# Patient Record
Sex: Female | Born: 1980 | Race: Black or African American | Marital: Married | Smoking: Never smoker
Health system: Southern US, Community
[De-identification: ages and names within clinical notes are randomized; demographics above are authoritative.]

## PROBLEM LIST (undated history)

## (undated) DIAGNOSIS — Z789 Other specified health status: Secondary | ICD-10-CM

## (undated) HISTORY — PX: KNEE SURGERY: SHX244

---

## 2004-01-28 ENCOUNTER — Other Ambulatory Visit: Admission: RE | Admit: 2004-01-28 | Discharge: 2004-01-28 | Payer: Self-pay | Admitting: Obstetrics and Gynecology

## 2004-04-24 ENCOUNTER — Other Ambulatory Visit: Admission: RE | Admit: 2004-04-24 | Discharge: 2004-04-24 | Payer: Self-pay | Admitting: Obstetrics and Gynecology

## 2005-02-16 ENCOUNTER — Other Ambulatory Visit: Admission: RE | Admit: 2005-02-16 | Discharge: 2005-02-16 | Payer: Self-pay | Admitting: Obstetrics and Gynecology

## 2005-04-29 ENCOUNTER — Other Ambulatory Visit: Admission: RE | Admit: 2005-04-29 | Discharge: 2005-04-29 | Payer: Self-pay | Admitting: Obstetrics and Gynecology

## 2006-05-05 ENCOUNTER — Other Ambulatory Visit: Admission: RE | Admit: 2006-05-05 | Discharge: 2006-05-05 | Payer: Self-pay | Admitting: Obstetrics and Gynecology

## 2006-12-09 ENCOUNTER — Ambulatory Visit (HOSPITAL_COMMUNITY): Admission: RE | Admit: 2006-12-09 | Discharge: 2006-12-09 | Payer: Self-pay | Admitting: Obstetrics and Gynecology

## 2007-04-28 ENCOUNTER — Inpatient Hospital Stay (HOSPITAL_COMMUNITY): Admission: AD | Admit: 2007-04-28 | Discharge: 2007-04-28 | Payer: Self-pay | Admitting: Obstetrics and Gynecology

## 2007-05-12 ENCOUNTER — Inpatient Hospital Stay (HOSPITAL_COMMUNITY): Admission: AD | Admit: 2007-05-12 | Discharge: 2007-05-14 | Payer: Self-pay | Admitting: Obstetrics and Gynecology

## 2007-08-28 ENCOUNTER — Other Ambulatory Visit: Admission: RE | Admit: 2007-08-28 | Discharge: 2007-08-28 | Payer: Self-pay | Admitting: Obstetrics and Gynecology

## 2008-07-17 ENCOUNTER — Other Ambulatory Visit: Admission: RE | Admit: 2008-07-17 | Discharge: 2008-07-17 | Payer: Self-pay | Admitting: Obstetrics and Gynecology

## 2009-09-25 ENCOUNTER — Other Ambulatory Visit: Admission: RE | Admit: 2009-09-25 | Discharge: 2009-09-25 | Payer: Self-pay | Admitting: Obstetrics and Gynecology

## 2010-10-28 ENCOUNTER — Other Ambulatory Visit: Payer: Self-pay | Admitting: Obstetrics and Gynecology

## 2010-10-28 ENCOUNTER — Other Ambulatory Visit (HOSPITAL_COMMUNITY)
Admission: RE | Admit: 2010-10-28 | Discharge: 2010-10-28 | Disposition: A | Discharge status: Home / Self Care | Payer: 59 | Source: Ambulatory Visit | Attending: Obstetrics and Gynecology | Admitting: Obstetrics and Gynecology

## 2010-10-28 DIAGNOSIS — Z01419 Encounter for gynecological examination (general) (routine) without abnormal findings: Secondary | ICD-10-CM | POA: Insufficient documentation

## 2011-01-05 NOTE — H&P (Signed)
NAMEJOELIE, Catherine Benson NO.:  [id number]   MEDICAL RECORD NO.:  [id number]          PATIENT TYPE:  MAT   LOCATION:  [facility]                          FACILITY:  [facility]   PHYSICIAN:  Gerald Leitz, MD          DATE OF BIRTH:  Dec 19, 1980   DATE OF ADMISSION:  04/28/2007  DATE OF DISCHARGE:  04/28/2007                              HISTORY & PHYSICAL   HISTORY AND PHYSICAL:  Patient scheduled for induction at [facility] on May 11, 2007.   HISTORY OF PRESENT ILLNESS:  This is a 30 year old G 2, P 0-0-1-0 at 63  weeks estimated gestational age based on last menstrual period of  August 03, 2006, confirmed by 7-week ultrasound, with estimated date  of delivery May 11, 2007.  Pregnancy complicated by gestational  hypertension.  Patient reports positive fetal movement.  No vaginal  bleeding, leakage of fluid or regular contractions.  No headache,  blurred vision or right upper quadrant pain.   PAST OB HISTORY:  Elective abortion x1.   PAST GYN HISTORY:  No history of abnormal Pap smears.  Last Pap smear  September of 2007, was normal.  Remote history of gonorrhea.   PAST MEDICAL HISTORY:  Negative.   PAST SURGICAL HISTORY:  Right ACL knee repair.   MEDICATIONS:  Prenatal vitamins, iron sulfate.   ALLERGIES:  PENICILLIN, which causes hives.   SOCIAL HISTORY:  Patient is single.  Denies tobacco, alcohol or illicit  drug use.   FAMILY HISTORY:  Mother with diabetes.   REVIEW OF SYSTEMS:  Negative except as stated in the history of current  illness.   PHYSICAL EXAMINATION:  VITAL SIGNS:  Blood pressure 138/84, weight 205  pounds.  CARDIOVASCULAR:  Regular rate and rhythm.  LUNGS:  Clear to auscultation bilaterally.  ABDOMEN:  Gravid, nontender.  EXTREMITIES:  One plus edema.  Two plus reflexes.  PELVIC:  Cervix is closed, soft, anterior.   ASSESSMENT AND PLAN:  A 40-week intrauterine pregnancy with gestational  hypertension, admit for Cervidil  induction.  Will monitor blood pressure  carefully.  Anticipate spontaneous vaginal delivery.      Gerald Leitz, MD  Electronically Signed     TC/MEDQ  D:  05/05/2007  T:  05/06/2007  Job:  [phone]

## 2011-06-03 LAB — CBC
HCT: 34.7 — ABNORMAL LOW
MCHC: 33.9
MCV: 94.7
Platelets: 235
RBC: 2.99 — ABNORMAL LOW
RDW: 15 — ABNORMAL HIGH
WBC: 21.1 — ABNORMAL HIGH

## 2011-06-03 LAB — COMPREHENSIVE METABOLIC PANEL
AST: 23
Albumin: 2.7 — ABNORMAL LOW
Calcium: 9.5
Creatinine, Ser: 0.91

## 2011-06-03 LAB — URINALYSIS, ROUTINE W REFLEX MICROSCOPIC
Nitrite: NEGATIVE
Specific Gravity, Urine: <1.005 — ABNORMAL LOW
Urobilinogen, UA: 0.2

## 2011-06-03 LAB — URIC ACID: Uric Acid, Serum: 7.3 — ABNORMAL HIGH

## 2011-06-03 LAB — RPR: RPR Ser Ql: NONREACTIVE

## 2011-06-04 LAB — CBC
HCT: 35 — ABNORMAL LOW
Platelets: 268
WBC: 7.9

## 2011-06-04 LAB — COMPREHENSIVE METABOLIC PANEL
ALT: 11
AST: 20
Alkaline Phosphatase: 195 — ABNORMAL HIGH
CO2: 23
Chloride: 106
Creatinine, Ser: 0.89
GFR calc non Af Amer: >60
Potassium: 4.2
Total Bilirubin: 0.6

## 2011-06-04 LAB — URINALYSIS, ROUTINE W REFLEX MICROSCOPIC
Glucose, UA: NEGATIVE
Protein, ur: NEGATIVE
pH: 6.5

## 2015-10-09 DIAGNOSIS — L309 Dermatitis, unspecified: Secondary | ICD-10-CM | POA: Insufficient documentation

## 2015-10-09 DIAGNOSIS — L280 Lichen simplex chronicus: Secondary | ICD-10-CM | POA: Insufficient documentation

## 2015-10-09 DIAGNOSIS — L700 Acne vulgaris: Secondary | ICD-10-CM | POA: Insufficient documentation

## 2017-10-27 ENCOUNTER — Encounter: Payer: Self-pay | Admitting: Obstetrics and Gynecology

## 2017-10-27 ENCOUNTER — Ambulatory Visit (INDEPENDENT_AMBULATORY_CARE_PROVIDER_SITE_OTHER): Payer: BLUE CROSS/BLUE SHIELD | Admitting: Obstetrics and Gynecology

## 2017-10-27 VITALS — BP 131/85 | HR 72 | Wt 204.6 lb

## 2017-10-27 DIAGNOSIS — Z01419 Encounter for gynecological examination (general) (routine) without abnormal findings: Secondary | ICD-10-CM | POA: Diagnosis not present

## 2017-10-27 DIAGNOSIS — Z1151 Encounter for screening for human papillomavirus (HPV): Secondary | ICD-10-CM

## 2017-10-27 DIAGNOSIS — Z124 Encounter for screening for malignant neoplasm of cervix: Secondary | ICD-10-CM | POA: Diagnosis not present

## 2017-10-27 NOTE — Progress Notes (Signed)
Subjective:     Catherine Benson is a 37 y.o. female P1 with Mirena IUD induced amenorrhea who is here for a comprehensive physical exam. The patient reports no problems. She is sexually active without complaints. She denies any pelvic pain or abnormal discharge. She denies any urinary incontinence. She does not plan to conceive No past medical history on file.  No family history on file.   Social History   Socioeconomic History  . Marital status: Single    Spouse name: Not on file  . Number of children: Not on file  . Years of education: Not on file  . Highest education level: Not on file  Social Needs  . Financial resource strain: Not on file  . Food insecurity - worry: Not on file  . Food insecurity - inability: Not on file  . Transportation needs - medical: Not on file  . Transportation needs - non-medical: Not on file  Occupational History  . Not on file  Tobacco Use  . Smoking status: Never Smoker  . Smokeless tobacco: Never Used  Substance and Sexual Activity  . Alcohol use: Yes  . Drug use: No  . Sexual activity: Yes  Other Topics Concern  . Not on file  Social History Narrative  . Not on file   Health Maintenance  Topic Date Due  . HIV Screening  04/24/1996  . TETANUS/TDAP  04/24/2000  . PAP SMEAR  10/27/2013  . INFLUENZA VACCINE  03/23/2017       Review of Systems Pertinent items are noted in HPI.   Objective:  Blood pressure 131/85, pulse 72, weight 204 lb 9.6 oz (92.8 kg).     GENERAL: Well-developed, well-nourished female in no acute distress.  HEENT: Normocephalic, atraumatic. Sclerae anicteric.  NECK: Supple. Normal thyroid.  LUNGS: Clear to auscultation bilaterally.  HEART: Regular rate and rhythm. BREASTS: Symmetric in size. No palpable masses or lymphadenopathy, skin changes, or nipple drainage. ABDOMEN: Soft, nontender, nondistended. No organomegaly. PELVIC: Normal external female genitalia. Vagina is pink and rugated.  Normal discharge. Normal  appearing cervix, IUD stings visualized extending 2 cm from the os. Uterus is normal in size. No adnexal mass or tenderness. EXTREMITIES: No cyanosis, clubbing, or edema, 2+ distal pulses.    Assessment:    Healthy female exam.      Plan:    Pap smear collected Health mainteance labs ordered Patient will be contacted with any abnormal results See After Visit Summary for Counseling Recommendations

## 2017-10-27 NOTE — Progress Notes (Signed)
LAST PAP 2012

## 2017-10-28 LAB — COMPREHENSIVE METABOLIC PANEL
A/G RATIO: 1.3 (ref 1.2–2.2)
ALT: 12 IU/L (ref 0–32)
AST: 18 IU/L (ref 0–40)
Albumin: 4.3 g/dL (ref 3.5–5.5)
Alkaline Phosphatase: 56 IU/L (ref 39–117)
BUN/Creatinine Ratio: 13 (ref 9–23)
BUN: 13 mg/dL (ref 6–20)
Bilirubin Total: 0.5 mg/dL (ref 0.0–1.2)
CALCIUM: 9.7 mg/dL (ref 8.7–10.2)
CO2: 24 mmol/L (ref 20–29)
CREATININE: 0.98 mg/dL (ref 0.57–1.00)
Chloride: 104 mmol/L (ref 96–106)
GFR calc Af Amer: 86 mL/min/{1.73_m2} (ref >59)
GFR calc non Af Amer: 74 mL/min/{1.73_m2} (ref >59)
GLOBULIN, TOTAL: 3.2 g/dL (ref 1.5–4.5)
Glucose: 76 mg/dL (ref 65–99)
POTASSIUM: 4.5 mmol/L (ref 3.5–5.2)
SODIUM: 142 mmol/L (ref 134–144)
Total Protein: 7.5 g/dL (ref 6.0–8.5)

## 2017-10-28 LAB — CYTOLOGY - PAP
DIAGNOSIS: NEGATIVE
HPV: NOT DETECTED

## 2017-10-28 LAB — LIPID PANEL
Chol/HDL Ratio: 2.8 ratio (ref 0.0–4.4)
Cholesterol, Total: 149 mg/dL (ref 100–199)
HDL: 53 mg/dL (ref >39)
LDL Calculated: 82 mg/dL (ref 0–99)
Triglycerides: 69 mg/dL (ref 0–149)
VLDL Cholesterol Cal: 14 mg/dL (ref 5–40)

## 2017-10-28 LAB — CBC
HEMATOCRIT: 39.3 % (ref 34.0–46.6)
Hemoglobin: 13 g/dL (ref 11.1–15.9)
MCH: 30 pg (ref 26.6–33.0)
MCHC: 33.1 g/dL (ref 31.5–35.7)
MCV: 91 fL (ref 79–97)
PLATELETS: 306 10*3/uL (ref 150–379)
RBC: 4.34 x10E6/uL (ref 3.77–5.28)
RDW: 13.6 % (ref 12.3–15.4)
WBC: 6.2 10*3/uL (ref 3.4–10.8)

## 2017-10-28 LAB — TSH: TSH: 0.842 u[IU]/mL (ref 0.450–4.500)

## 2017-10-28 LAB — HEMOGLOBIN A1C
ESTIMATED AVERAGE GLUCOSE: 114 mg/dL
Hgb A1c MFr Bld: 5.6 % (ref 4.8–5.6)

## 2018-09-05 ENCOUNTER — Encounter: Payer: Self-pay | Admitting: Radiology

## 2019-06-19 DIAGNOSIS — Z20828 Contact with and (suspected) exposure to other viral communicable diseases: Secondary | ICD-10-CM | POA: Diagnosis not present

## 2019-06-27 DIAGNOSIS — Z20828 Contact with and (suspected) exposure to other viral communicable diseases: Secondary | ICD-10-CM | POA: Diagnosis not present

## 2019-11-12 DIAGNOSIS — Z20822 Contact with and (suspected) exposure to covid-19: Secondary | ICD-10-CM | POA: Diagnosis not present

## 2019-12-27 DIAGNOSIS — R0789 Other chest pain: Secondary | ICD-10-CM | POA: Diagnosis not present

## 2019-12-27 DIAGNOSIS — S20219A Contusion of unspecified front wall of thorax, initial encounter: Secondary | ICD-10-CM | POA: Diagnosis not present

## 2019-12-27 DIAGNOSIS — M7989 Other specified soft tissue disorders: Secondary | ICD-10-CM | POA: Diagnosis not present

## 2019-12-27 DIAGNOSIS — M542 Cervicalgia: Secondary | ICD-10-CM | POA: Diagnosis not present

## 2019-12-27 DIAGNOSIS — S6992XA Unspecified injury of left wrist, hand and finger(s), initial encounter: Secondary | ICD-10-CM | POA: Diagnosis not present

## 2019-12-27 DIAGNOSIS — S20212A Contusion of left front wall of thorax, initial encounter: Secondary | ICD-10-CM | POA: Diagnosis not present

## 2020-01-07 DIAGNOSIS — Z20822 Contact with and (suspected) exposure to covid-19: Secondary | ICD-10-CM | POA: Diagnosis not present

## 2020-01-15 DIAGNOSIS — F4323 Adjustment disorder with mixed anxiety and depressed mood: Secondary | ICD-10-CM | POA: Diagnosis not present

## 2020-03-31 DIAGNOSIS — Z20822 Contact with and (suspected) exposure to covid-19: Secondary | ICD-10-CM | POA: Diagnosis not present

## 2020-05-27 DIAGNOSIS — Z20822 Contact with and (suspected) exposure to covid-19: Secondary | ICD-10-CM | POA: Diagnosis not present

## 2020-10-28 ENCOUNTER — Telehealth: Payer: Self-pay

## 2020-10-28 NOTE — Telephone Encounter (Signed)
Pt calling to see when her IUD was inserted.  [phone]  Adv 01/26/2016.

## 2020-12-02 DIAGNOSIS — M9901 Segmental and somatic dysfunction of cervical region: Secondary | ICD-10-CM | POA: Diagnosis not present

## 2020-12-02 DIAGNOSIS — M62830 Muscle spasm of back: Secondary | ICD-10-CM | POA: Diagnosis not present

## 2020-12-02 DIAGNOSIS — M9902 Segmental and somatic dysfunction of thoracic region: Secondary | ICD-10-CM | POA: Diagnosis not present

## 2020-12-02 DIAGNOSIS — M5412 Radiculopathy, cervical region: Secondary | ICD-10-CM | POA: Diagnosis not present

## 2020-12-30 DIAGNOSIS — M62830 Muscle spasm of back: Secondary | ICD-10-CM | POA: Diagnosis not present

## 2020-12-30 DIAGNOSIS — M9901 Segmental and somatic dysfunction of cervical region: Secondary | ICD-10-CM | POA: Diagnosis not present

## 2020-12-30 DIAGNOSIS — M5412 Radiculopathy, cervical region: Secondary | ICD-10-CM | POA: Diagnosis not present

## 2020-12-30 DIAGNOSIS — M9902 Segmental and somatic dysfunction of thoracic region: Secondary | ICD-10-CM | POA: Diagnosis not present

## 2021-02-11 DIAGNOSIS — M62830 Muscle spasm of back: Secondary | ICD-10-CM | POA: Diagnosis not present

## 2021-02-11 DIAGNOSIS — M5412 Radiculopathy, cervical region: Secondary | ICD-10-CM | POA: Diagnosis not present

## 2021-02-11 DIAGNOSIS — M9901 Segmental and somatic dysfunction of cervical region: Secondary | ICD-10-CM | POA: Diagnosis not present

## 2021-02-11 DIAGNOSIS — M9902 Segmental and somatic dysfunction of thoracic region: Secondary | ICD-10-CM | POA: Diagnosis not present

## 2021-03-11 DIAGNOSIS — M5412 Radiculopathy, cervical region: Secondary | ICD-10-CM | POA: Diagnosis not present

## 2021-03-11 DIAGNOSIS — M62830 Muscle spasm of back: Secondary | ICD-10-CM | POA: Diagnosis not present

## 2021-03-11 DIAGNOSIS — M9902 Segmental and somatic dysfunction of thoracic region: Secondary | ICD-10-CM | POA: Diagnosis not present

## 2021-03-11 DIAGNOSIS — M9901 Segmental and somatic dysfunction of cervical region: Secondary | ICD-10-CM | POA: Diagnosis not present

## 2021-03-12 DIAGNOSIS — Z20822 Contact with and (suspected) exposure to covid-19: Secondary | ICD-10-CM | POA: Diagnosis not present

## 2021-03-12 DIAGNOSIS — J0140 Acute pansinusitis, unspecified: Secondary | ICD-10-CM | POA: Diagnosis not present

## 2021-04-08 DIAGNOSIS — M9902 Segmental and somatic dysfunction of thoracic region: Secondary | ICD-10-CM | POA: Diagnosis not present

## 2021-04-08 DIAGNOSIS — M9901 Segmental and somatic dysfunction of cervical region: Secondary | ICD-10-CM | POA: Diagnosis not present

## 2021-04-08 DIAGNOSIS — M5412 Radiculopathy, cervical region: Secondary | ICD-10-CM | POA: Diagnosis not present

## 2021-04-08 DIAGNOSIS — M62830 Muscle spasm of back: Secondary | ICD-10-CM | POA: Diagnosis not present

## 2021-04-29 ENCOUNTER — Ambulatory Visit (INDEPENDENT_AMBULATORY_CARE_PROVIDER_SITE_OTHER): Payer: BC Managed Care – PPO | Admitting: Nurse Practitioner

## 2021-04-29 ENCOUNTER — Other Ambulatory Visit (HOSPITAL_COMMUNITY)
Admission: RE | Admit: 2021-04-29 | Discharge: 2021-04-29 | Disposition: A | Discharge status: Home / Self Care | Payer: BC Managed Care – PPO | Source: Ambulatory Visit | Attending: Nurse Practitioner | Admitting: Nurse Practitioner

## 2021-04-29 ENCOUNTER — Encounter: Payer: Self-pay | Admitting: Nurse Practitioner

## 2021-04-29 ENCOUNTER — Other Ambulatory Visit: Payer: Self-pay

## 2021-04-29 VITALS — BP 122/80 | HR 84 | Temp 98.4°F | Ht 63.2 in | Wt 189.4 lb

## 2021-04-29 DIAGNOSIS — Z2821 Immunization not carried out because of patient refusal: Secondary | ICD-10-CM | POA: Diagnosis not present

## 2021-04-29 DIAGNOSIS — R238 Other skin changes: Secondary | ICD-10-CM

## 2021-04-29 DIAGNOSIS — Z113 Encounter for screening for infections with a predominantly sexual mode of transmission: Secondary | ICD-10-CM | POA: Diagnosis not present

## 2021-04-29 DIAGNOSIS — E6609 Other obesity due to excess calories: Secondary | ICD-10-CM

## 2021-04-29 DIAGNOSIS — Z23 Encounter for immunization: Secondary | ICD-10-CM

## 2021-04-29 DIAGNOSIS — Z8616 Personal history of COVID-19: Secondary | ICD-10-CM

## 2021-04-29 DIAGNOSIS — Z124 Encounter for screening for malignant neoplasm of cervix: Secondary | ICD-10-CM | POA: Diagnosis not present

## 2021-04-29 DIAGNOSIS — Z1159 Encounter for screening for other viral diseases: Secondary | ICD-10-CM | POA: Diagnosis not present

## 2021-04-29 DIAGNOSIS — Z833 Family history of diabetes mellitus: Secondary | ICD-10-CM | POA: Diagnosis not present

## 2021-04-29 DIAGNOSIS — Z0000 Encounter for general adult medical examination without abnormal findings: Secondary | ICD-10-CM

## 2021-04-29 DIAGNOSIS — R233 Spontaneous ecchymoses: Secondary | ICD-10-CM

## 2021-04-29 DIAGNOSIS — Z114 Encounter for screening for human immunodeficiency virus [HIV]: Secondary | ICD-10-CM | POA: Diagnosis not present

## 2021-04-29 DIAGNOSIS — E66811 Body mass index (BMI) 33.0-33.9, adult: Secondary | ICD-10-CM

## 2021-04-29 DIAGNOSIS — Z7689 Persons encountering health services in other specified circumstances: Secondary | ICD-10-CM

## 2021-04-29 DIAGNOSIS — Z30433 Encounter for removal and reinsertion of intrauterine contraceptive device: Secondary | ICD-10-CM

## 2021-04-29 DIAGNOSIS — Z862 Personal history of diseases of the blood and blood-forming organs and certain disorders involving the immune mechanism: Secondary | ICD-10-CM

## 2021-04-29 DIAGNOSIS — Z6833 Body mass index (BMI) 33.0-33.9, adult: Secondary | ICD-10-CM

## 2021-04-29 DIAGNOSIS — Z1231 Encounter for screening mammogram for malignant neoplasm of breast: Secondary | ICD-10-CM

## 2021-04-29 NOTE — Patient Instructions (Signed)
Health Maintenance, Female Adopting a healthy lifestyle and getting preventive care are important in promoting health and wellness. Ask your health care provider about: The right schedule for you to have regular tests and exams. Things you can do on your own to prevent diseases and keep yourself healthy. What should I know about diet, weight, and exercise? Eat a healthy diet  Eat a diet that includes plenty of vegetables, fruits, low-fat dairy products, and lean protein. Do not eat a lot of foods that are high in solid fats, added sugars, or sodium. Maintain a healthy weight Body mass index (BMI) is used to identify weight problems. It estimates body fat based on height and weight. Your health care provider can help determine your BMI and help you achieve or maintain a healthy weight. Get regular exercise Get regular exercise. This is one of the most important things you can do for your health. Most adults should: Exercise for at least 150 minutes each week. The exercise should increase your heart rate and make you sweat (moderate-intensity exercise). Do strengthening exercises at least twice a week. This is in addition to the moderate-intensity exercise. Spend less time sitting. Even light physical activity can be beneficial. Watch cholesterol and blood lipids Have your blood tested for lipids and cholesterol at 40 years of age, then have this test every 5 years. Have your cholesterol levels checked more often if: Your lipid or cholesterol levels are high. You are older than 40 years of age. You are at high risk for heart disease. What should I know about cancer screening? Depending on your health history and family history, you may need to have cancer screening at various ages. This may include screening for: Breast cancer. Cervical cancer. Colorectal cancer. Skin cancer. Lung cancer. What should I know about heart disease, diabetes, and high blood pressure? Blood pressure and heart  disease High blood pressure causes heart disease and increases the risk of stroke. This is more likely to develop in people who have high blood pressure readings, are of African descent, or are overweight. Have your blood pressure checked: Every 3-5 years if you are 18-39 years of age. Every year if you are 40 years old or older. Diabetes Have regular diabetes screenings. This checks your fasting blood sugar level. Have the screening done: Once every three years after age 40 if you are at a normal weight and have a low risk for diabetes. More often and at a younger age if you are overweight or have a high risk for diabetes. What should I know about preventing infection? Hepatitis B If you have a higher risk for hepatitis B, you should be screened for this virus. Talk with your health care provider to find out if you are at risk for hepatitis B infection. Hepatitis C Testing is recommended for: Everyone born from 1945 through 1965. Anyone with known risk factors for hepatitis C. Sexually transmitted infections (STIs) Get screened for STIs, including gonorrhea and chlamydia, if: You are sexually active and are younger than 40 years of age. You are older than 40 years of age and your health care provider tells you that you are at risk for this type of infection. Your sexual activity has changed since you were last screened, and you are at increased risk for chlamydia or gonorrhea. Ask your health care provider if you are at risk. Ask your health care provider about whether you are at high risk for HIV. Your health care provider may recommend a prescription medicine   to help prevent HIV infection. If you choose to take medicine to prevent HIV, you should first get tested for HIV. You should then be tested every 3 months for as long as you are taking the medicine. Pregnancy If you are about to stop having your period (premenopausal) and you may become pregnant, seek counseling before you get  pregnant. Take 400 to 800 micrograms (mcg) of folic acid every day if you become pregnant. Ask for birth control (contraception) if you want to prevent pregnancy. Osteoporosis and menopause Osteoporosis is a disease in which the bones lose minerals and strength with aging. This can result in bone fractures. If you are 65 years old or older, or if you are at risk for osteoporosis and fractures, ask your health care provider if you should: Be screened for bone loss. Take a calcium or vitamin D supplement to lower your risk of fractures. Be given hormone replacement therapy (HRT) to treat symptoms of menopause. Follow these instructions at home: Lifestyle Do not use any products that contain nicotine or tobacco, such as cigarettes, e-cigarettes, and chewing tobacco. If you need help quitting, ask your health care provider. Do not use street drugs. Do not share needles. Ask your health care provider for help if you need support or information about quitting drugs. Alcohol use Do not drink alcohol if: Your health care provider tells you not to drink. You are pregnant, may be pregnant, or are planning to become pregnant. If you drink alcohol: Limit how much you use to 0-1 drink a day. Limit intake if you are breastfeeding. Be aware of how much alcohol is in your drink. In the [place] one drink equals one 12 oz bottle of beer (355 mL), one 5 oz glass of wine (148 mL), or one 1 oz glass of hard liquor (44 mL). General instructions Schedule regular health, dental, and eye exams. Stay current with your vaccines. Tell your health care provider if: You often feel depressed. You have ever been abused or do not feel safe at home. Summary Adopting a healthy lifestyle and getting preventive care are important in promoting health and wellness. Follow your health care provider's instructions about healthy diet, exercising, and getting tested or screened for diseases. Follow your health care provider's  instructions on monitoring your cholesterol and blood pressure. This information is not intended to replace advice given to you by your health care provider. Make sure you discuss any questions you have with your health care provider. Document Revised: 10/17/2020 Document Reviewed: 08/02/2018 Elsevier Patient Education  2022 Elsevier Inc.  

## 2021-04-29 NOTE — Progress Notes (Signed)
I,Yamilka Roman [employer] as a Education administrator for [employer], FNP.,have documented all relevant documentation on the behalf of Minette Brine, FNP,as directed by  Minette Brine, FNP while in the presence of Minette Brine, [place].   This visit occurred during the SARS-CoV-2 public health emergency.  Safety protocols were in place, including screening questions prior to the visit, additional usage of staff PPE, and extensive cleaning of exam room while observing appropriate contact time as indicated for disinfecting solutions.  Subjective:     Patient ID: Catherine Benson , female    DOB: 02-Nov-1980 , 40 y.o.   MRN: [id number]   Chief Complaint  Patient presents with   Establish Care   Annual Exam    HPI  Patient presents today to establish care. Her last physical was with Ollen Gross. She works as an Human resources officer.  Married with one child (girl) - 55 y/o.  Masters in [employer] with a concentration in [place].   She would like to have a physical done and would also like a referral to a GYN.  Wt Readings from Last 3 Encounters: 04/29/21 : 189 lb 6.4 oz (85.9 kg) 10/27/17 : 204 lb 9.6 oz (92.8 kg)      History reviewed. No pertinent past medical history.   Family History  Problem Relation Age of Onset   Diabetes Mother    Fibromyalgia Mother    Sarcoidosis Mother    COPD Father    Heart attack Father    Cancer Maternal Grandfather      Current Outpatient Medications:    levonorgestrel (MIRENA) 20 MCG/24HR IUD, by Intrauterine route., Disp: , Rfl:    fluconazole (DIFLUCAN) 100 MG tablet, Take 1 tablet (100 mg total) by mouth daily. Take 1 tablet by mouth now repeat in 5 days, Disp: 2 tablet, Rfl: 0   metroNIDAZOLE (FLAGYL) 500 MG tablet, Take 1 tablet (500 mg total) by mouth 3 (three) times daily for 10 days., Disp: 30 tablet, Rfl: 0   Vitamin D, Ergocalciferol, (DRISDOL) 1.25 MG (50000 UNIT) CAPS capsule, Take 1 capsule (50,000 Units total) by mouth every 7 (seven)  days., Disp: 12 capsule, Rfl: 1   Allergies  Allergen Reactions   Other Hives    Capers   Peach [Prunus Persica] Hives   Penicillins       The patient states she uses IUD for birth control. Last LMP was No LMP recorded. (Menstrual status: IUD).. Negative for Dysmenorrhea and Negative for Menorrhagia. Negative for: breast discharge, breast lump(s), breast pain and breast self exam. Associated symptoms include abnormal vaginal bleeding. Pertinent negatives include abnormal bleeding (hematology), anxiety, decreased libido, depression, difficulty falling sleep, dyspareunia, history of infertility, nocturia, sexual dysfunction, sleep disturbances, urinary incontinence, urinary urgency, vaginal discharge and vaginal itching. Diet regular.The patient states her exercise level is minimal.   The patient's tobacco use is:  Social History   Tobacco Use  Smoking Status Never   Passive exposure: Never  Smokeless Tobacco Never   She has been exposed to passive smoke. The patient's alcohol use is:  Social History   Substance and Sexual Activity  Alcohol Use Yes   Comment: socially   Additional information: Last pap 10/27/2017, next one scheduled for today.    Review of Systems  Constitutional: Negative.   HENT: Negative.    Eyes: Negative.   Respiratory: Negative.    Cardiovascular: Negative.   Gastrointestinal: Negative.   Endocrine: Negative.   Genitourinary: Negative.   Musculoskeletal: Negative.   Skin: Negative.  Allergic/Immunologic: Negative.   Neurological: Negative.   Hematological: Negative.   Psychiatric/Behavioral: Negative.      Today's Vitals   04/29/21 1604  BP: 122/80  Pulse: 84  Temp: 98.4 F (36.9 C)  Weight: 189 lb 6.4 oz (85.9 kg)  Height: 5' 3.2" (1.605 m)  PainSc: 0-No pain   Body mass index is 33.34 kg/m.   Objective:  Physical Exam Exam conducted with a chaperone present.  Constitutional:      General: She is not in acute distress.     Appearance: Normal appearance. She is well-developed. She is obese.  HENT:     Head: Normocephalic and atraumatic.     Right Ear: Hearing, tympanic membrane, ear canal and external ear normal. There is no impacted cerumen.     Left Ear: Hearing, tympanic membrane, ear canal and external ear normal. There is no impacted cerumen.     Nose:     Comments: Deferred - masked    Mouth/Throat:     Comments: Deferred - masked Eyes:     General: Lids are normal.     Extraocular Movements: Extraocular movements intact.     Conjunctiva/sclera: Conjunctivae normal.     Pupils: Pupils are equal, round, and reactive to light.     Funduscopic exam:    Right eye: No papilledema.        Left eye: No papilledema.  Neck:     Thyroid: No thyroid mass.     Vascular: No carotid bruit.  Cardiovascular:     Rate and Rhythm: Normal rate and regular rhythm.     Pulses: Normal pulses.     Heart sounds: Normal heart sounds. No murmur heard. Pulmonary:     Effort: Pulmonary effort is normal.     Breath sounds: Normal breath sounds.  Chest:     Chest wall: No mass.  Breasts:    Tanner Score is 5.     Right: Normal. No mass or tenderness.     Left: Normal. No mass or tenderness.  Abdominal:     General: Abdomen is flat. Bowel sounds are normal. There is no distension.     Palpations: Abdomen is soft.     Tenderness: There is no abdominal tenderness.  Genitourinary:    Exam position: Lithotomy position.     Tanner stage (genital): 5.     Labia:        Right: No rash or tenderness.        Left: No rash or tenderness.      Vagina: Normal.     Cervix: Normal.     Uterus: Normal.      Adnexa: Right adnexa normal and left adnexa normal.       Right: No tenderness.         Left: No tenderness.    Musculoskeletal:        General: No swelling. Normal range of motion.     Cervical back: Full passive range of motion without pain, normal range of motion and neck supple.     Right lower leg: No edema.      Left lower leg: No edema.  Lymphadenopathy:     Upper Body:     Right upper body: No supraclavicular, axillary or pectoral adenopathy.     Left upper body: No supraclavicular, axillary or pectoral adenopathy.  Skin:    General: Skin is warm and dry.     Capillary Refill: Capillary refill takes less than 2 seconds.  Neurological:  General: No focal deficit present.     Mental Status: She is alert and oriented to person, place, and time.     Cranial Nerves: No cranial nerve deficit.     Sensory: No sensory deficit.  Psychiatric:        Mood and Affect: Mood normal.        Behavior: Behavior normal.        Thought Content: Thought content normal.        Judgment: Judgment normal.        Assessment And Plan:     1. Encounter for general adult medical examination w/o abnormal findings Behavior modifications discussed and diet history reviewed.   Pt will continue to exercise regularly and modify diet with low GI, plant based foods and decrease intake of processed foods.  Recommend intake of daily multivitamin, Vitamin D, and calcium.  Recommend mammogram (up to date) for preventive screenings, as well as recommend immunizations that include influenza, TDAP (will try to look up) - CBC - CMP14+EGFR - Lipid panel  2. Screening for HIV (human immunodeficiency virus) - HIV antibody (with reflex)  3. Encounter for hepatitis C screening test for low risk patient Will check Hepatitis C screening due to recent recommendations to screen all adults 18 years and older - Hepatitis C antibody  4. Influenza vaccination declined Patient declined influenza vaccination at this time. Patient is aware that influenza vaccine prevents illness in 70% of healthy people, and reduces hospitalizations to 30-70% in elderly. This vaccine is recommended annually. Pt is willing to accept risk associated with refusing vaccination.  5. Class 1 obesity due to excess calories with body mass index (BMI) of 33.0  to 33.9 in adult, unspecified whether serious comorbidity present Chronic Discussed healthy diet and regular exercise options  Encouraged to exercise at least 150 minutes per week with 2 days of strength training  6. Encounter for IUD removal and reinsertion - Ambulatory referral to Obstetrics / Gynecology  7. History of COVID-19 Comments: She had Covid during Christmas 2021  8. Encounter for Papanicolaou smear of cervix Physical exam done, no abnormal findings. - Cytology -Pap Smear  9. Screening for STD (sexually transmitted disease) - Cervicovaginal ancillary only - T pallidum Screening [facility]  10. Family history of diabetes mellitus - Hemoglobin A1c  11. History of sickle cell trait  12. Easy bruising - Iron, TIBC and Ferritin Panel - VITAMIN D 25 Hydroxy (Vit-D Deficiency, Fractures)  13. Establishing care with new doctor, encounter for     Patient was given opportunity to ask questions. Patient verbalized understanding of the plan and was able to repeat key elements of the plan. All questions were answered to their satisfaction.   Minette Brine, FNP   I, Minette Brine, FNP, have reviewed all documentation for this visit. The documentation on 05/13/21 for the exam, diagnosis, procedures, and orders are all accurate and complete.  THE PATIENT IS ENCOURAGED TO PRACTICE SOCIAL DISTANCING DUE TO THE COVID-19 PANDEMIC.

## 2021-04-30 LAB — LIPID PANEL
Chol/HDL Ratio: 2.2 ratio (ref 0.0–4.4)
Cholesterol, Total: 172 mg/dL (ref 100–199)
HDL: 80 mg/dL (ref >39)
LDL Chol Calc (NIH): 71 mg/dL (ref 0–99)
Triglycerides: 120 mg/dL (ref 0–149)
VLDL Cholesterol Cal: 21 mg/dL (ref 5–40)

## 2021-04-30 LAB — CMP14+EGFR
ALT: 11 IU/L (ref 0–32)
AST: 22 IU/L (ref 0–40)
Albumin/Globulin Ratio: 1.6 (ref 1.2–2.2)
Albumin: 4.6 g/dL (ref 3.8–4.8)
Alkaline Phosphatase: 59 IU/L (ref 44–121)
BUN/Creatinine Ratio: 17 (ref 9–23)
BUN: 19 mg/dL (ref 6–24)
Bilirubin Total: 0.6 mg/dL (ref 0.0–1.2)
CO2: 22 mmol/L (ref 20–29)
Calcium: 9.9 mg/dL (ref 8.7–10.2)
Chloride: 99 mmol/L (ref 96–106)
Creatinine, Ser: 1.12 mg/dL — ABNORMAL HIGH (ref 0.57–1.00)
Globulin, Total: 2.8 g/dL (ref 1.5–4.5)
Glucose: 86 mg/dL (ref 65–99)
Potassium: 4.5 mmol/L (ref 3.5–5.2)
Sodium: 136 mmol/L (ref 134–144)
Total Protein: 7.4 g/dL (ref 6.0–8.5)
eGFR: 64 mL/min/{1.73_m2} (ref >59)

## 2021-04-30 LAB — HIV ANTIBODY (ROUTINE TESTING W REFLEX): HIV Screen 4th Generation wRfx: NONREACTIVE

## 2021-04-30 LAB — T PALLIDUM SCREENING CASCADE: T pallidum Antibodies (TP-PA): NONREACTIVE

## 2021-04-30 LAB — IRON,TIBC AND FERRITIN PANEL
Ferritin: 126 ng/mL (ref 15–150)
Iron Saturation: 22 % (ref 15–55)
Iron: 67 ug/dL (ref 27–159)
Total Iron Binding Capacity: 311 ug/dL (ref 250–450)
UIBC: 244 ug/dL (ref 131–425)

## 2021-04-30 LAB — HEMOGLOBIN A1C
Est. average glucose Bld gHb Est-mCnc: 108 mg/dL
Hgb A1c MFr Bld: 5.4 % (ref 4.8–5.6)

## 2021-04-30 LAB — CBC
Hematocrit: 41.1 % (ref 34.0–46.6)
Hemoglobin: 13.7 g/dL (ref 11.1–15.9)
MCH: 30.8 pg (ref 26.6–33.0)
MCHC: 33.3 g/dL (ref 31.5–35.7)
MCV: 92 fL (ref 79–97)
Platelets: 324 10*3/uL (ref 150–450)
RBC: 4.45 x10E6/uL (ref 3.77–5.28)
RDW: 12.8 % (ref 11.7–15.4)
WBC: 5.8 10*3/uL (ref 3.4–10.8)

## 2021-04-30 LAB — HEPATITIS C ANTIBODY: Hep C Virus Ab: <0.1 s/co ratio (ref 0.0–0.9)

## 2021-04-30 LAB — VITAMIN D 25 HYDROXY (VIT D DEFICIENCY, FRACTURES): Vit D, 25-Hydroxy: 22.6 ng/mL — ABNORMAL LOW (ref 30.0–100.0)

## 2021-05-01 ENCOUNTER — Other Ambulatory Visit: Payer: Self-pay | Admitting: Nurse Practitioner

## 2021-05-01 DIAGNOSIS — E559 Vitamin D deficiency, unspecified: Secondary | ICD-10-CM

## 2021-05-01 LAB — CERVICOVAGINAL ANCILLARY ONLY
Bacterial Vaginitis (gardnerella): POSITIVE — AB
Candida Glabrata: NEGATIVE
Candida Vaginitis: POSITIVE — AB
Chlamydia: NEGATIVE
Comment: NEGATIVE
Comment: NEGATIVE
Comment: NEGATIVE
Comment: NEGATIVE
Comment: NEGATIVE
Comment: NORMAL
Neisseria Gonorrhea: NEGATIVE
Trichomonas: NEGATIVE

## 2021-05-01 MED ORDER — VITAMIN D (ERGOCALCIFEROL) 1.25 MG (50000 UNIT) PO CAPS: 50000.0000 [IU] | ORAL_CAPSULE | ORAL | 1 refills | Status: DC

## 2021-05-03 ENCOUNTER — Other Ambulatory Visit: Payer: Self-pay | Admitting: Nurse Practitioner

## 2021-05-03 DIAGNOSIS — B9689 Other specified bacterial agents as the cause of diseases classified elsewhere: Secondary | ICD-10-CM

## 2021-05-03 DIAGNOSIS — B3731 Acute candidiasis of vulva and vagina: Secondary | ICD-10-CM

## 2021-05-03 DIAGNOSIS — N760 Acute vaginitis: Secondary | ICD-10-CM

## 2021-05-03 DIAGNOSIS — B373 Candidiasis of vulva and vagina: Secondary | ICD-10-CM

## 2021-05-03 MED ORDER — FLUCONAZOLE 100 MG PO TABS: 100.0000 mg | ORAL_TABLET | Freq: Every day | ORAL | 0 refills | Status: DC

## 2021-05-03 MED ORDER — METRONIDAZOLE 500 MG PO TABS: 500.0000 mg | ORAL_TABLET | Freq: Three times a day (TID) | ORAL | 0 refills | Status: AC

## 2021-05-05 LAB — CYTOLOGY - PAP: Diagnosis: NEGATIVE

## 2021-05-06 DIAGNOSIS — M9902 Segmental and somatic dysfunction of thoracic region: Secondary | ICD-10-CM | POA: Diagnosis not present

## 2021-05-06 DIAGNOSIS — M9901 Segmental and somatic dysfunction of cervical region: Secondary | ICD-10-CM | POA: Diagnosis not present

## 2021-05-06 DIAGNOSIS — M5412 Radiculopathy, cervical region: Secondary | ICD-10-CM | POA: Diagnosis not present

## 2021-05-06 DIAGNOSIS — M62830 Muscle spasm of back: Secondary | ICD-10-CM | POA: Diagnosis not present

## 2021-05-27 ENCOUNTER — Other Ambulatory Visit: Payer: Self-pay

## 2021-05-27 DIAGNOSIS — E559 Vitamin D deficiency, unspecified: Secondary | ICD-10-CM

## 2021-05-27 MED ORDER — VITAMIN D (ERGOCALCIFEROL) 1.25 MG (50000 UNIT) PO CAPS: 50000.0000 [IU] | ORAL_CAPSULE | ORAL | 1 refills | Status: DC

## 2021-06-03 DIAGNOSIS — M9901 Segmental and somatic dysfunction of cervical region: Secondary | ICD-10-CM | POA: Diagnosis not present

## 2021-06-03 DIAGNOSIS — M9902 Segmental and somatic dysfunction of thoracic region: Secondary | ICD-10-CM | POA: Diagnosis not present

## 2021-06-03 DIAGNOSIS — M62830 Muscle spasm of back: Secondary | ICD-10-CM | POA: Diagnosis not present

## 2021-06-03 DIAGNOSIS — M5412 Radiculopathy, cervical region: Secondary | ICD-10-CM | POA: Diagnosis not present

## 2021-06-25 DIAGNOSIS — Z30433 Encounter for removal and reinsertion of intrauterine contraceptive device: Secondary | ICD-10-CM | POA: Diagnosis not present

## 2021-07-15 ENCOUNTER — Ambulatory Visit: Payer: BC Managed Care – PPO

## 2021-07-19 ENCOUNTER — Emergency Department
Admission: EM | Admit: 2021-07-19 | Discharge: 2021-07-19 | Disposition: A | Discharge status: Home / Self Care | Payer: Self-pay | Attending: Emergency Medicine | Admitting: Emergency Medicine

## 2021-07-19 DIAGNOSIS — Z0283 Encounter for blood-alcohol and blood-drug test: Secondary | ICD-10-CM | POA: Insufficient documentation

## 2021-07-19 HISTORY — DX: Other specified health status: Z78.9
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
## 2021-07-19 NOTE — ED Notes (Signed)
Bed: BL25  Expected date:   Expected time:   Means of arrival:   Comments:  [employer]

## 2021-07-29 DIAGNOSIS — Z30431 Encounter for routine checking of intrauterine contraceptive device: Secondary | ICD-10-CM | POA: Diagnosis not present

## 2021-07-29 DIAGNOSIS — N898 Other specified noninflammatory disorders of vagina: Secondary | ICD-10-CM | POA: Diagnosis not present

## 2021-08-20 ENCOUNTER — Ambulatory Visit
Admission: RE | Admit: 2021-08-20 | Discharge: 2021-08-20 | Disposition: A | Discharge status: Home / Self Care | Payer: BC Managed Care – PPO | Source: Ambulatory Visit | Attending: Nurse Practitioner | Admitting: Nurse Practitioner

## 2021-08-20 DIAGNOSIS — Z1231 Encounter for screening mammogram for malignant neoplasm of breast: Secondary | ICD-10-CM

## 2021-11-24 DIAGNOSIS — M9902 Segmental and somatic dysfunction of thoracic region: Secondary | ICD-10-CM | POA: Diagnosis not present

## 2021-11-24 DIAGNOSIS — M5412 Radiculopathy, cervical region: Secondary | ICD-10-CM | POA: Diagnosis not present

## 2021-11-24 DIAGNOSIS — M62830 Muscle spasm of back: Secondary | ICD-10-CM | POA: Diagnosis not present

## 2021-11-24 DIAGNOSIS — M9901 Segmental and somatic dysfunction of cervical region: Secondary | ICD-10-CM | POA: Diagnosis not present

## 2021-12-08 DIAGNOSIS — M62830 Muscle spasm of back: Secondary | ICD-10-CM | POA: Diagnosis not present

## 2021-12-08 DIAGNOSIS — M5412 Radiculopathy, cervical region: Secondary | ICD-10-CM | POA: Diagnosis not present

## 2021-12-08 DIAGNOSIS — M9901 Segmental and somatic dysfunction of cervical region: Secondary | ICD-10-CM | POA: Diagnosis not present

## 2021-12-08 DIAGNOSIS — M9902 Segmental and somatic dysfunction of thoracic region: Secondary | ICD-10-CM | POA: Diagnosis not present

## 2021-12-22 DIAGNOSIS — M9901 Segmental and somatic dysfunction of cervical region: Secondary | ICD-10-CM | POA: Diagnosis not present

## 2021-12-22 DIAGNOSIS — M62830 Muscle spasm of back: Secondary | ICD-10-CM | POA: Diagnosis not present

## 2021-12-22 DIAGNOSIS — M9902 Segmental and somatic dysfunction of thoracic region: Secondary | ICD-10-CM | POA: Diagnosis not present

## 2021-12-22 DIAGNOSIS — M5412 Radiculopathy, cervical region: Secondary | ICD-10-CM | POA: Diagnosis not present

## 2022-02-16 DIAGNOSIS — M9902 Segmental and somatic dysfunction of thoracic region: Secondary | ICD-10-CM | POA: Diagnosis not present

## 2022-02-16 DIAGNOSIS — M62830 Muscle spasm of back: Secondary | ICD-10-CM | POA: Diagnosis not present

## 2022-02-16 DIAGNOSIS — M5412 Radiculopathy, cervical region: Secondary | ICD-10-CM | POA: Diagnosis not present

## 2022-02-16 DIAGNOSIS — M9901 Segmental and somatic dysfunction of cervical region: Secondary | ICD-10-CM | POA: Diagnosis not present

## 2022-05-06 ENCOUNTER — Encounter: Payer: Self-pay | Admitting: Nurse Practitioner

## 2022-05-06 ENCOUNTER — Other Ambulatory Visit: Payer: Self-pay

## 2022-05-06 ENCOUNTER — Other Ambulatory Visit (HOSPITAL_COMMUNITY)
Admission: RE | Admit: 2022-05-06 | Discharge: 2022-05-06 | Disposition: A | Discharge status: Home / Self Care | Payer: BC Managed Care – PPO | Source: Ambulatory Visit | Attending: Nurse Practitioner | Admitting: Nurse Practitioner

## 2022-05-06 ENCOUNTER — Ambulatory Visit (INDEPENDENT_AMBULATORY_CARE_PROVIDER_SITE_OTHER): Payer: BC Managed Care – PPO | Admitting: Nurse Practitioner

## 2022-05-06 VITALS — BP 134/70 | HR 79 | Temp 98.2°F | Ht 63.2 in | Wt 193.0 lb

## 2022-05-06 DIAGNOSIS — E559 Vitamin D deficiency, unspecified: Secondary | ICD-10-CM

## 2022-05-06 DIAGNOSIS — Z6833 Body mass index (BMI) 33.0-33.9, adult: Secondary | ICD-10-CM | POA: Diagnosis not present

## 2022-05-06 DIAGNOSIS — E6609 Other obesity due to excess calories: Secondary | ICD-10-CM | POA: Diagnosis not present

## 2022-05-06 DIAGNOSIS — Z1159 Encounter for screening for other viral diseases: Secondary | ICD-10-CM | POA: Diagnosis not present

## 2022-05-06 DIAGNOSIS — Z0000 Encounter for general adult medical examination without abnormal findings: Secondary | ICD-10-CM | POA: Diagnosis not present

## 2022-05-06 DIAGNOSIS — Z114 Encounter for screening for human immunodeficiency virus [HIV]: Secondary | ICD-10-CM

## 2022-05-06 DIAGNOSIS — Z113 Encounter for screening for infections with a predominantly sexual mode of transmission: Secondary | ICD-10-CM

## 2022-05-06 DIAGNOSIS — Z124 Encounter for screening for malignant neoplasm of cervix: Secondary | ICD-10-CM

## 2022-05-06 DIAGNOSIS — Z2821 Immunization not carried out because of patient refusal: Secondary | ICD-10-CM

## 2022-05-06 MED ORDER — VITAMIN D (ERGOCALCIFEROL) 1.25 MG (50000 UNIT) PO CAPS: 50000.0000 [IU] | ORAL_CAPSULE | ORAL | 1 refills | Status: AC

## 2022-05-06 NOTE — Patient Instructions (Signed)

## 2022-05-06 NOTE — Progress Notes (Signed)
I,Tianna Badgett,acting as a Education administrator for [employer], FNP.,have documented all relevant documentation on the behalf of Minette Brine, FNP,as directed by  Minette Brine, FNP while in the presence of Minette Brine, [place].   Subjective:     Patient ID: Catherine Benson , female    DOB: [birth year] , 41 y.o.   MRN: [id number]   No chief complaint on file.   HPI  Patient presents today for HM.   Wt Readings from Last 3 Encounters: 05/06/22 : 193 lb (87.5 kg) 04/29/21 : 189 lb 6.4 oz (85.9 kg) 10/27/17 : 204 lb 9.6 oz (92.8 kg)       History reviewed. No pertinent past medical history.   Family History  Problem Relation Age of Onset   Diabetes Mother    Fibromyalgia Mother    Sarcoidosis Mother    COPD Father    Heart attack Father    Cancer Maternal Grandfather      Current Outpatient Medications:    levonorgestrel (MIRENA) 20 MCG/24HR IUD, by Intrauterine route., Disp: , Rfl:    Vitamin D, Ergocalciferol, (DRISDOL) 1.25 MG (50000 UNIT) CAPS capsule, Take 1 capsule (50,000 Units total) by mouth every 7 (seven) days., Disp: 24 capsule, Rfl: 1   Allergies  Allergen Reactions   Other Hives    Capers   Peach [Prunus Persica] Hives   Penicillins       The patient states she uses IUD for birth control.  No LMP recorded. (Menstrual status: IUD).. Negative for Dysmenorrhea and Negative for Menorrhagia. Negative for: breast discharge, breast lump(s), breast pain and breast self exam. Associated symptoms include abnormal vaginal bleeding. Pertinent negatives include abnormal bleeding (hematology), anxiety, decreased libido, depression, difficulty falling sleep, dyspareunia, history of infertility, nocturia, sexual dysfunction, sleep disturbances, urinary incontinence, urinary urgency, vaginal discharge and vaginal itching. Diet regular - she eats a lot of salads and drinks adequate water intake. The patient states her exercise level is 4 days a week.   The patient's tobacco use is:  Social  History   Tobacco Use  Smoking Status Never   Passive exposure: Never  Smokeless Tobacco Never   She has been exposed to passive smoke. The patient's alcohol use is:  Social History   Substance and Sexual Activity  Alcohol Use Yes   Comment: socially   Additional information: Last pap 04/29/2021, next one scheduled for 04/2022 - patient prefers to do yearly. Reports no history of abnormal PAPs or cervical cancer.     Review of Systems  Constitutional: Negative.   HENT: Negative.    Eyes: Negative.   Respiratory: Negative.    Cardiovascular: Negative.   Gastrointestinal:  Negative for abdominal pain.  Endocrine: Negative.   Genitourinary: Negative.   Musculoskeletal: Negative.   Skin: Negative.   Allergic/Immunologic: Negative.   Neurological: Negative.   Hematological: Negative.   Psychiatric/Behavioral: Negative.       Today's Vitals   05/06/22 1110  BP: 134/70  Pulse: 79  Temp: 98.2 F (36.8 C)  TempSrc: Oral  Weight: 193 lb (87.5 kg)  Height: 5' 3.2" (1.605 m)   Body mass index is 33.97 kg/m.  Wt Readings from Last 3 Encounters:  05/06/22 193 lb (87.5 kg)  04/29/21 189 lb 6.4 oz (85.9 kg)  10/27/17 204 lb 9.6 oz (92.8 kg)    Objective:  Physical Exam Vitals reviewed. Exam conducted with a chaperone present.  Constitutional:      General: She is not in acute distress.    Appearance: Normal  appearance. She is well-developed. She is obese.  HENT:     Head: Normocephalic and atraumatic.     Right Ear: Hearing, tympanic membrane, ear canal and external ear normal. There is no impacted cerumen.     Left Ear: Hearing, tympanic membrane, ear canal and external ear normal. There is no impacted cerumen.     Nose:     Comments: Deferred - masked    Mouth/Throat:     Comments: Deferred - masked Eyes:     General: Lids are normal.     Extraocular Movements: Extraocular movements intact.     Conjunctiva/sclera: Conjunctivae normal.     Pupils: Pupils are equal,  round, and reactive to light.     Funduscopic exam:    Right eye: No papilledema.        Left eye: No papilledema.  Neck:     Thyroid: No thyroid mass.     Vascular: No carotid bruit.  Cardiovascular:     Rate and Rhythm: Normal rate and regular rhythm.     Pulses: Normal pulses.     Heart sounds: Normal heart sounds. No murmur heard. Pulmonary:     Effort: Pulmonary effort is normal.     Breath sounds: Normal breath sounds.  Chest:     Chest wall: No mass.  Breasts:    Tanner Score is 5.     Right: Normal. No mass or tenderness.     Left: Normal. No mass or tenderness.  Abdominal:     General: Abdomen is flat. Bowel sounds are normal. There is no distension.     Palpations: Abdomen is soft.     Tenderness: There is no abdominal tenderness.  Genitourinary:    Exam position: Lithotomy position.     Tanner stage (genital): 5.     Labia:        Right: No rash or tenderness.        Left: No rash or tenderness.      Vagina: Normal.     Cervix: Normal.     Uterus: Normal.      Adnexa: Right adnexa normal and left adnexa normal.       Right: No tenderness.         Left: No tenderness.    Musculoskeletal:        General: No swelling. Normal range of motion.     Cervical back: Full passive range of motion without pain, normal range of motion and neck supple.     Right lower leg: No edema.     Left lower leg: No edema.  Lymphadenopathy:     Upper Body:     Right upper body: No supraclavicular, axillary or pectoral adenopathy.     Left upper body: No supraclavicular, axillary or pectoral adenopathy.  Skin:    General: Skin is warm and dry.     Capillary Refill: Capillary refill takes less than 2 seconds.  Neurological:     General: No focal deficit present.     Mental Status: She is alert and oriented to person, place, and time.     Cranial Nerves: No cranial nerve deficit.     Sensory: No sensory deficit.  Psychiatric:        Mood and Affect: Mood normal.        Behavior:  Behavior normal.        Thought Content: Thought content normal.        Judgment: Judgment normal.  Assessment And Plan:     1. Encounter for general adult medical examination w/o abnormal findings Behavior modifications discussed and diet history reviewed.   Pt will continue to exercise regularly and modify diet with low GI, plant based foods and decrease intake of processed foods.  Recommend intake of daily multivitamin, Vitamin D, and calcium.  Recommend mammogram for preventive screenings, as well as recommend immunizations that include influenza, TDAP - CMP14+EGFR - CBC  2. Vitamin D deficiency Will check vitamin D level and supplement as needed.    Also encouraged to spend 15 minutes in the sun daily.  - VITAMIN D 25 Hydroxy (Vit-D Deficiency, Fractures)  3. Class 1 obesity due to excess calories with body mass index (BMI) of 33.0 to 33.9 in adult, unspecified whether serious comorbidity present Chronic Discussed healthy diet and regular exercise options  Encouraged to exercise at least 150 minutes per week with 2 days of strength training She is encouraged to strive for BMI less than 30 to decrease cardiac risk. A  4. Influenza vaccination declined Patient declined influenza vaccination at this time. Patient is aware that influenza vaccine prevents illness in 70% of healthy people, and reduces hospitalizations to 30-70% in elderly. This vaccine is recommended annually. Education has been provided regarding the importance of this vaccine but patient still declined. Advised may receive this vaccine at local pharmacy or Health Dept.or vaccine clinic. Aware to provide a copy of the vaccination record if obtained from local pharmacy or Health Dept.  Pt is willing to accept risk associated with refusing vaccination.  5. Screening for STD (sexually transmitted disease) - Hepatitis B surface antigen - RPR - Cervicovaginal ancillary only - HSV 1 and 2 Ab, IgG - Cytology -Pap  Smear  6. Encounter for HIV (human immunodeficiency virus) test - HIV Antibody (routine testing w rflx)  7. Encounter for hepatitis C screening test for low risk patient Will check Hepatitis C screening due to recent recommendations to screen all adults 18 years and older - Hepatitis C antibody  8. Encounter for Papanicolaou smear of cervix No abnormal findings on physical exam - Cytology -Pap Smear   Patient was given opportunity to ask questions. Patient verbalized understanding of the plan and was able to repeat key elements of the plan. All questions were answered to their satisfaction.   Minette Brine, FNP   I, Minette Brine, FNP, have reviewed all documentation for this visit. The documentation on 05/06/22 for the exam, diagnosis, procedures, and orders are all accurate and complete.   THE PATIENT IS ENCOURAGED TO PRACTICE SOCIAL DISTANCING DUE TO THE COVID-19 PANDEMIC.

## 2022-05-07 LAB — CBC
Hematocrit: 42 % (ref 34.0–46.6)
Hemoglobin: 14.1 g/dL (ref 11.1–15.9)
MCH: 31.6 pg (ref 26.6–33.0)
MCHC: 33.6 g/dL (ref 31.5–35.7)
MCV: 94 fL (ref 79–97)
Platelets: 300 10*3/uL (ref 150–450)
RBC: 4.46 x10E6/uL (ref 3.77–5.28)
RDW: 12.5 % (ref 11.7–15.4)
WBC: 5.5 10*3/uL (ref 3.4–10.8)

## 2022-05-07 LAB — CMP14+EGFR
ALT: 15 IU/L (ref 0–32)
AST: 24 IU/L (ref 0–40)
Albumin/Globulin Ratio: 1.7 (ref 1.2–2.2)
Albumin: 4.8 g/dL (ref 3.9–4.9)
Alkaline Phosphatase: 53 IU/L (ref 44–121)
BUN/Creatinine Ratio: 13 (ref 9–23)
BUN: 14 mg/dL (ref 6–24)
Bilirubin Total: 1.1 mg/dL (ref 0.0–1.2)
CO2: 23 mmol/L (ref 20–29)
Calcium: 10 mg/dL (ref 8.7–10.2)
Chloride: 98 mmol/L (ref 96–106)
Creatinine, Ser: 1.05 mg/dL — ABNORMAL HIGH (ref 0.57–1.00)
Globulin, Total: 2.8 g/dL (ref 1.5–4.5)
Glucose: 85 mg/dL (ref 70–99)
Potassium: 4.3 mmol/L (ref 3.5–5.2)
Sodium: 137 mmol/L (ref 134–144)
Total Protein: 7.6 g/dL (ref 6.0–8.5)
eGFR: 68 mL/min/{1.73_m2} (ref >59)

## 2022-05-07 LAB — HEPATITIS C ANTIBODY: Hep C Virus Ab: NONREACTIVE

## 2022-05-07 LAB — HSV 1 AND 2 AB, IGG
HSV 1 Glycoprotein G Ab, IgG: <0.91 index (ref 0.00–0.90)
HSV 2 IgG, Type Spec: >23.6 index — ABNORMAL HIGH (ref 0.00–0.90)

## 2022-05-07 LAB — RPR: RPR Ser Ql: NONREACTIVE

## 2022-05-07 LAB — VITAMIN D 25 HYDROXY (VIT D DEFICIENCY, FRACTURES): Vit D, 25-Hydroxy: 35.6 ng/mL (ref 30.0–100.0)

## 2022-05-07 LAB — HEPATITIS B SURFACE ANTIGEN: Hepatitis B Surface Ag: NEGATIVE

## 2022-05-07 LAB — HIV ANTIBODY (ROUTINE TESTING W REFLEX): HIV Screen 4th Generation wRfx: NONREACTIVE

## 2022-05-10 ENCOUNTER — Other Ambulatory Visit (HOSPITAL_COMMUNITY)
Admission: RE | Admit: 2022-05-10 | Discharge: 2022-05-10 | Disposition: A | Discharge status: Home / Self Care | Payer: BC Managed Care – PPO | Source: Ambulatory Visit | Attending: Nurse Practitioner | Admitting: Nurse Practitioner

## 2022-05-10 DIAGNOSIS — Z124 Encounter for screening for malignant neoplasm of cervix: Secondary | ICD-10-CM | POA: Diagnosis not present

## 2022-05-10 DIAGNOSIS — Z113 Encounter for screening for infections with a predominantly sexual mode of transmission: Secondary | ICD-10-CM | POA: Insufficient documentation

## 2022-05-10 LAB — CERVICOVAGINAL ANCILLARY ONLY
Chlamydia: NEGATIVE
Comment: NEGATIVE
Comment: NEGATIVE
Comment: NORMAL
Neisseria Gonorrhea: NEGATIVE
Trichomonas: NEGATIVE

## 2022-05-17 LAB — CYTOLOGY - PAP
Chlamydia: NEGATIVE
Comment: NEGATIVE
Comment: NEGATIVE
Comment: NEGATIVE
Comment: NEGATIVE
Comment: NORMAL
Diagnosis: NEGATIVE
HSV1: NEGATIVE
HSV2: NEGATIVE
High risk HPV: NEGATIVE
Neisseria Gonorrhea: NEGATIVE
Trichomonas: NEGATIVE

## 2022-12-17 DIAGNOSIS — M62830 Muscle spasm of back: Secondary | ICD-10-CM | POA: Diagnosis not present

## 2022-12-17 DIAGNOSIS — M9902 Segmental and somatic dysfunction of thoracic region: Secondary | ICD-10-CM | POA: Diagnosis not present

## 2022-12-17 DIAGNOSIS — M9901 Segmental and somatic dysfunction of cervical region: Secondary | ICD-10-CM | POA: Diagnosis not present

## 2022-12-17 DIAGNOSIS — M5412 Radiculopathy, cervical region: Secondary | ICD-10-CM | POA: Diagnosis not present

## 2023-05-10 NOTE — Progress Notes (Unsigned)
Madelaine Bhat, CMA,acting as a Neurosurgeon for Arnette Felts, FNP.,have documented all relevant documentation on the behalf of Arnette Felts, FNP,as directed by  Arnette Felts, FNP while in the presence of Arnette Felts, FNP.  Subjective:    Patient ID: Catherine Benson , female    DOB: [birth year] , 42 y.o.   MRN: [id number]  No chief complaint on file.   HPI  Patient presents today for HM, patient reports compliance with medications. Patient denies any chest pain, SOB, or headaches. Patient has no concerns today.     No past medical history on file.   Family History  Problem Relation Age of Onset  . Diabetes Mother   . Fibromyalgia Mother   . Sarcoidosis Mother   . COPD Father   . Heart attack Father   . Cancer Maternal Grandfather      Current Outpatient Medications:  .  levonorgestrel (MIRENA) 20 MCG/24HR IUD, by Intrauterine route., Disp: , Rfl:  .  Vitamin D, Ergocalciferol, (DRISDOL) 1.25 MG (50000 UNIT) CAPS capsule, Take 1 capsule (50,000 Units total) by mouth every 7 (seven) days., Disp: 24 capsule, Rfl: 1   Allergies  Allergen Reactions  . Other Hives    Capers  . Peach [Prunus Persica] Hives  . Penicillins       The patient states she uses {contraceptive methods:5051} for birth control. No LMP recorded. (Menstrual status: IUD).. {Dysmenorrhea-menorrhagia:21918}. Negative for: breast discharge, breast lump(s), breast pain and breast self exam. Associated symptoms include abnormal vaginal bleeding. Pertinent negatives include abnormal bleeding (hematology), anxiety, decreased libido, depression, difficulty falling sleep, dyspareunia, history of infertility, nocturia, sexual dysfunction, sleep disturbances, urinary incontinence, urinary urgency, vaginal discharge and vaginal itching. Diet regular.The patient states her exercise level is    . The patient's tobacco use is:  Social History   Tobacco Use  Smoking Status Never  . Passive exposure: Never  Smokeless Tobacco Never  .  She has been exposed to passive smoke. The patient's alcohol use is:  Social History   Substance and Sexual Activity  Alcohol Use Yes   Comment: socially  . Additional information: Last pap ***, next one scheduled for ***.    Review of Systems  Constitutional: Negative.   HENT: Negative.    Eyes: Negative.   Respiratory: Negative.    Cardiovascular: Negative.   Gastrointestinal: Negative.   Endocrine: Negative.   Genitourinary: Negative.   Musculoskeletal: Negative.   Skin: Negative.   Allergic/Immunologic: Negative.   Neurological: Negative.   Hematological: Negative.   Psychiatric/Behavioral: Negative.      There were no vitals filed for this visit. There is no height or weight on file to calculate BMI.  Wt Readings from Last 3 Encounters:  05/06/22 193 lb (87.5 kg)  04/29/21 189 lb 6.4 oz (85.9 kg)  10/27/17 204 lb 9.6 oz (92.8 kg)     Objective:  Physical Exam      Assessment And Plan:     Encounter for annual health examination  Vitamin D deficiency     No follow-ups on file. Patient was given opportunity to ask questions. Patient verbalized understanding of the plan and was able to repeat key elements of the plan. All questions were answered to their satisfaction.   Arnette Felts, FNP  I, Arnette Felts, FNP, have reviewed all documentation for this visit. The documentation on 05/10/23 for the exam, diagnosis, procedures, and orders are all accurate and complete.

## 2023-05-12 ENCOUNTER — Encounter: Payer: BC Managed Care – PPO | Admitting: Nurse Practitioner

## 2023-05-12 DIAGNOSIS — E559 Vitamin D deficiency, unspecified: Secondary | ICD-10-CM

## 2023-05-12 DIAGNOSIS — Z0000 Encounter for general adult medical examination without abnormal findings: Secondary | ICD-10-CM

## 2023-09-03 IMAGING — MG MM DIGITAL SCREENING BILAT W/ TOMO AND CAD
8 series · 8 of 24 positions shown · non-contrast
Comparison: None.

CLINICAL DATA: Screening. Baseline.

EXAM:
DIGITAL SCREENING BILATERAL MAMMOGRAM WITH TOMOSYNTHESIS AND CAD
TECHNIQUE: Bilateral screening digital craniocaudal and mediolateral oblique
mammograms were obtained. Bilateral screening digital breast
tomosynthesis was performed. The images were evaluated with
computer-aided detection.

[R MLO synth-2D]
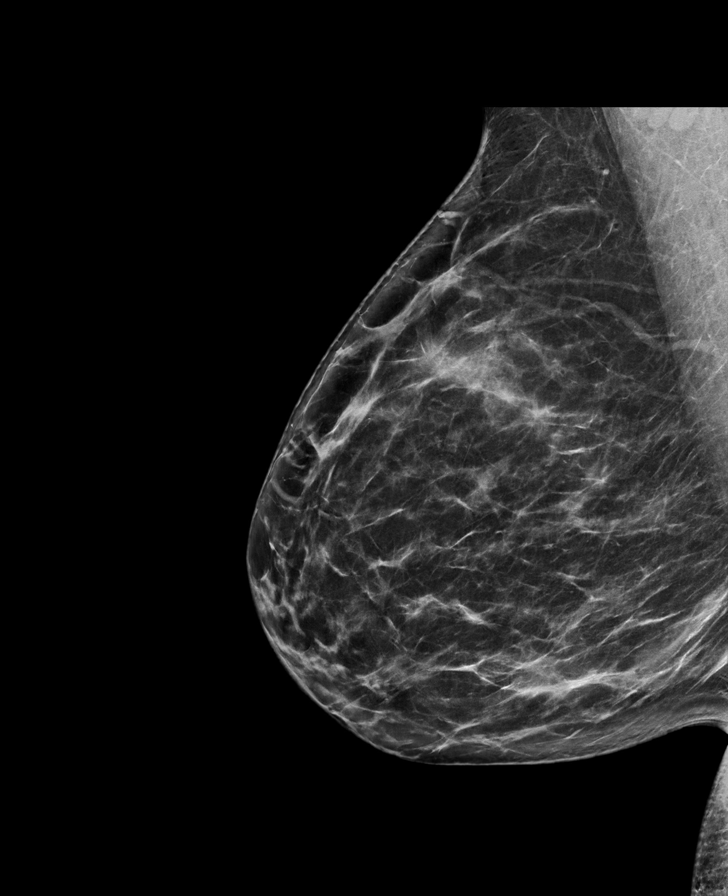

[R CC synth-2D]
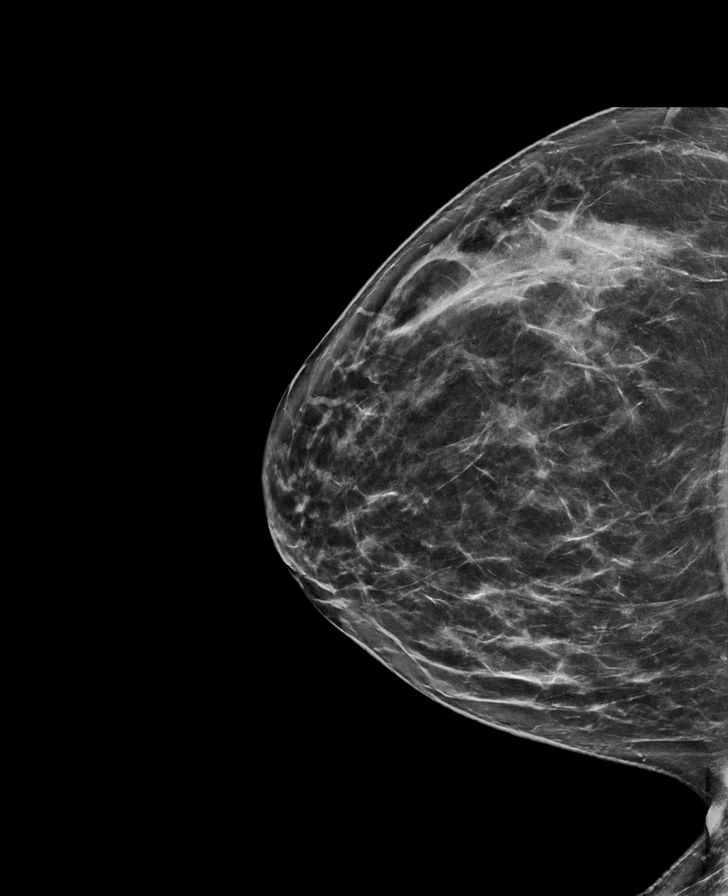

[L CC synth-2D]
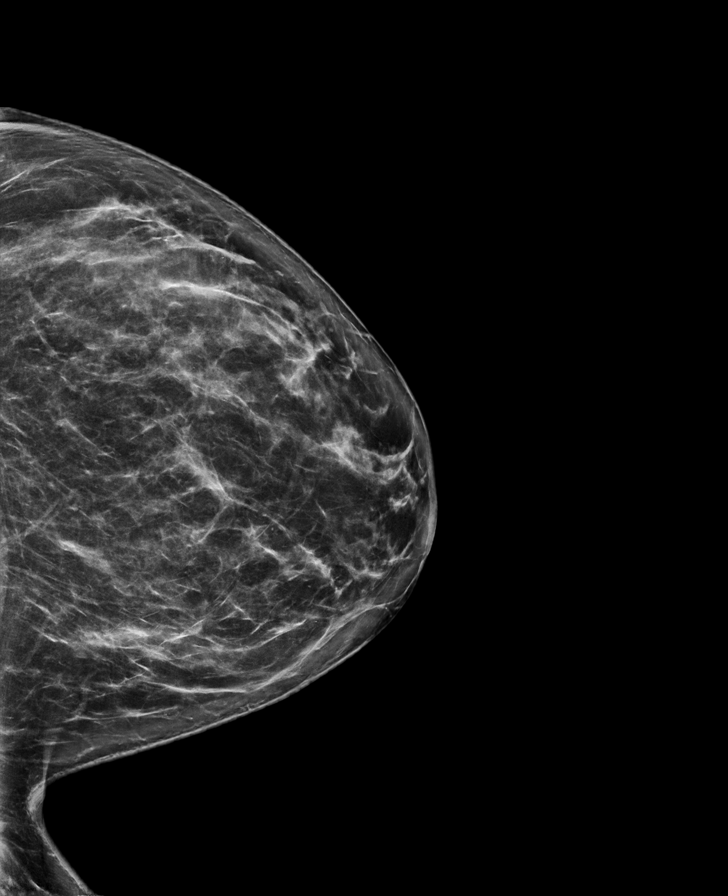

[L MLO synth-2D]
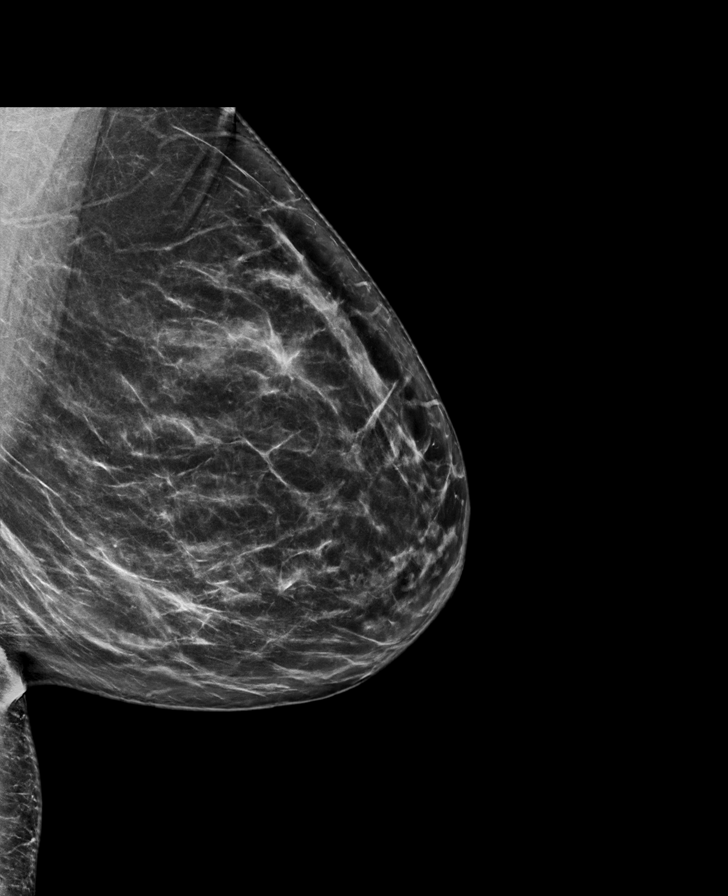

[L MLO tomo · tomo slice 42/83.0]
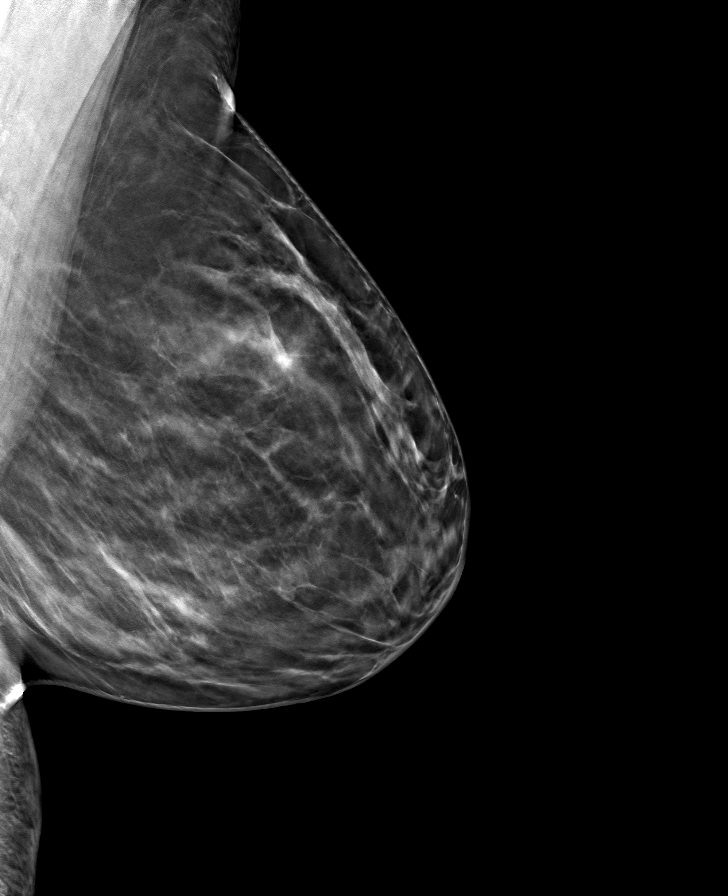

[L CC tomo · tomo slice 39/78.0]
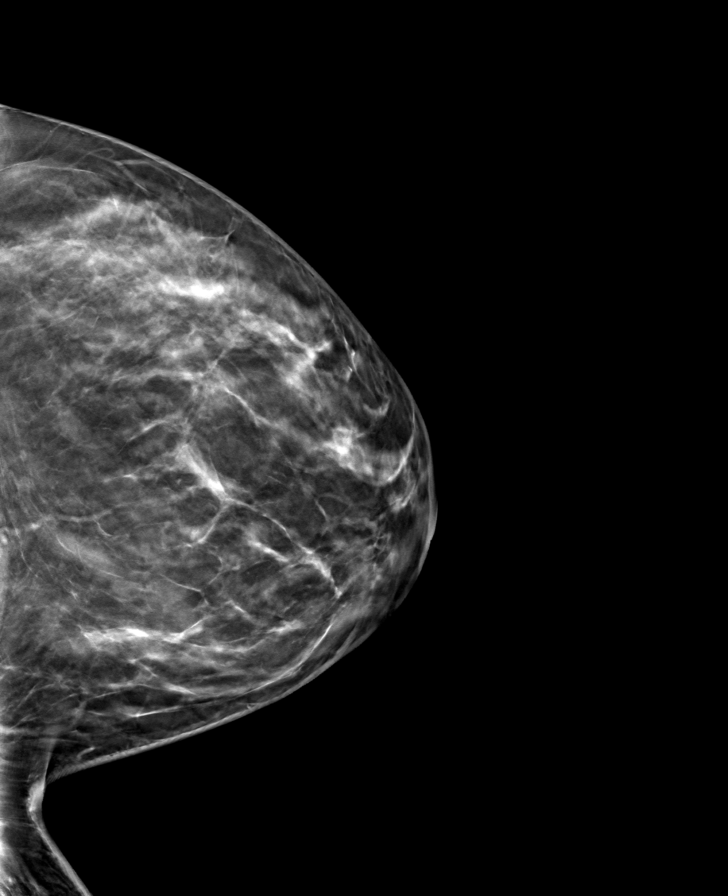

[R MLO tomo · tomo slice 41/81.0]
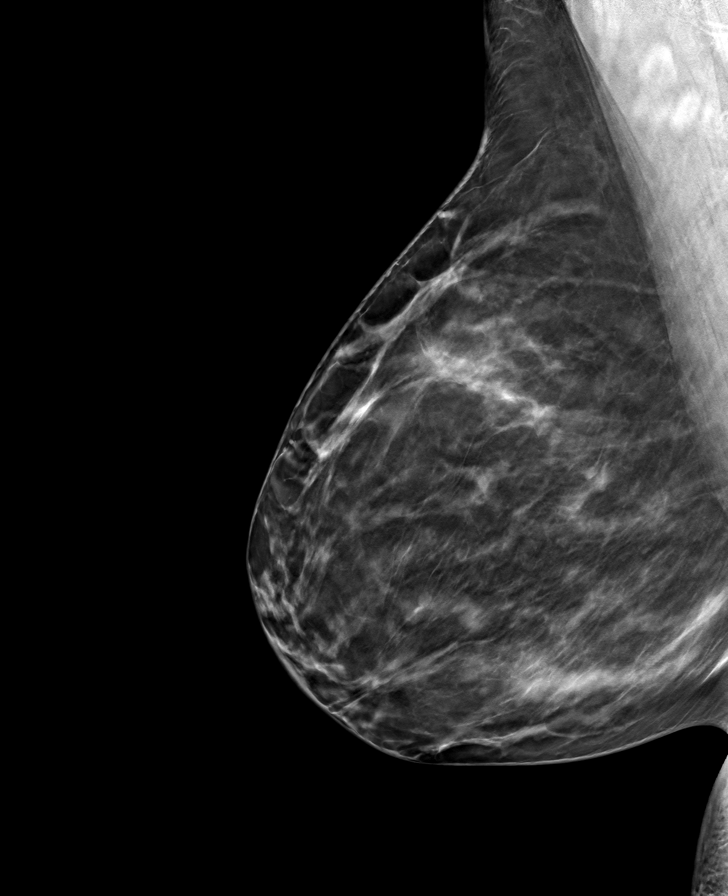

[R CC tomo · tomo slice 39/77.0]
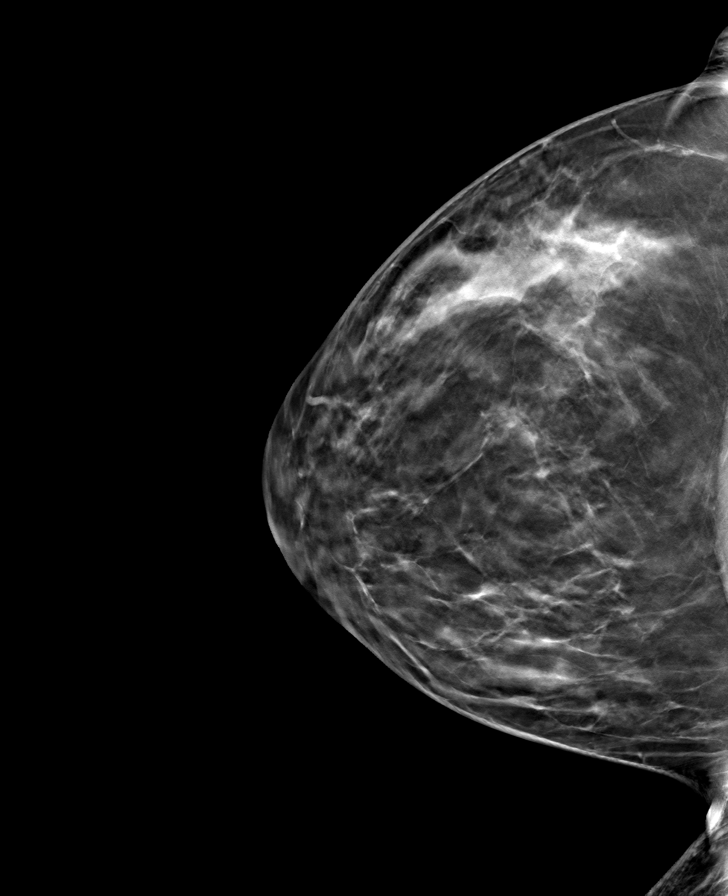

[8 of 24 positions shown; findings below may reference images not displayed]

ACR Breast Density Category c: The breast tissue is heterogeneously
dense, which may obscure small masses
FINDINGS: There are no findings suspicious for malignancy.
IMPRESSION: No mammographic evidence of malignancy. A result letter of this
screening mammogram will be mailed directly to the patient.

RECOMMENDATION:
Screening mammogram in one year. (Code:R4-H-R0S)

BI-RADS CATEGORY  1: Negative.

## 2024-07-26 ENCOUNTER — Telehealth: Payer: Self-pay

## 2024-07-26 NOTE — Telephone Encounter (Signed)
 LVM- to see if she was still a patient here if so she needs to schedule appt ASAP.
